# Patient Record
Sex: Female | Born: 1970 | Race: White | Hispanic: No | Smoking: Never smoker
Health system: Southern US, Community
[De-identification: ages and names within clinical notes are randomized; demographics above are authoritative.]

## PROBLEM LIST (undated history)

## (undated) DIAGNOSIS — M545 Low back pain, unspecified: Secondary | ICD-10-CM

## (undated) DIAGNOSIS — K219 Gastro-esophageal reflux disease without esophagitis: Secondary | ICD-10-CM

## (undated) DIAGNOSIS — G629 Polyneuropathy, unspecified: Secondary | ICD-10-CM

## (undated) DIAGNOSIS — N879 Dysplasia of cervix uteri, unspecified: Secondary | ICD-10-CM

## (undated) DIAGNOSIS — K589 Irritable bowel syndrome without diarrhea: Secondary | ICD-10-CM

## (undated) DIAGNOSIS — L719 Rosacea, unspecified: Secondary | ICD-10-CM

## (undated) DIAGNOSIS — G8929 Other chronic pain: Secondary | ICD-10-CM

## (undated) DIAGNOSIS — F32A Depression, unspecified: Secondary | ICD-10-CM

## (undated) DIAGNOSIS — F419 Anxiety disorder, unspecified: Secondary | ICD-10-CM

## (undated) DIAGNOSIS — F329 Major depressive disorder, single episode, unspecified: Secondary | ICD-10-CM

## (undated) HISTORY — DX: Polyneuropathy, unspecified: G62.9

## (undated) HISTORY — DX: Dysplasia of cervix uteri, unspecified: N87.9

## (undated) HISTORY — DX: Anxiety disorder, unspecified: F41.9

## (undated) HISTORY — PX: MICRODISCECTOMY LUMBAR: SUR864

## (undated) HISTORY — DX: Gastro-esophageal reflux disease without esophagitis: K21.9

## (undated) HISTORY — DX: Low back pain, unspecified: G89.29

## (undated) HISTORY — DX: Irritable bowel syndrome, unspecified: K58.9

## (undated) HISTORY — DX: Low back pain: M54.5

## (undated) HISTORY — DX: Low back pain, unspecified: M54.50

## (undated) HISTORY — DX: Major depressive disorder, single episode, unspecified: F32.9

## (undated) HISTORY — DX: Depression, unspecified: F32.A

## (undated) HISTORY — DX: Rosacea, unspecified: L71.9

---

## 2011-08-25 ENCOUNTER — Other Ambulatory Visit: Payer: Self-pay | Admitting: Gastroenterology

## 2011-08-25 DIAGNOSIS — R1011 Right upper quadrant pain: Secondary | ICD-10-CM

## 2011-09-12 ENCOUNTER — Other Ambulatory Visit (HOSPITAL_COMMUNITY): Payer: Self-pay

## 2011-09-20 ENCOUNTER — Other Ambulatory Visit (HOSPITAL_COMMUNITY): Payer: Self-pay

## 2011-09-27 ENCOUNTER — Ambulatory Visit (HOSPITAL_COMMUNITY)
Admission: RE | Admit: 2011-09-27 | Discharge: 2011-09-27 | Disposition: A | Discharge status: Home / Self Care | Payer: BC Managed Care – PPO | Source: Ambulatory Visit | Attending: Gastroenterology | Admitting: Gastroenterology

## 2011-09-27 DIAGNOSIS — R1011 Right upper quadrant pain: Secondary | ICD-10-CM

## 2011-09-27 DIAGNOSIS — K824 Cholesterolosis of gallbladder: Secondary | ICD-10-CM | POA: Insufficient documentation

## 2011-09-27 MED ORDER — SINCALIDE 5 MCG IJ SOLR
INTRAMUSCULAR | Status: AC
  Filled 2011-09-27: qty 5

## 2011-09-27 MED ORDER — SINCALIDE 5 MCG IJ SOLR
0.0200 ug/kg | Freq: Once | INTRAMUSCULAR | Status: AC
  Administered 2011-09-27: 1.26 ug via INTRAVENOUS

## 2011-09-27 MED ORDER — TECHNETIUM TC 99M MEBROFENIN IV KIT
5.0000 | PACK | Freq: Once | INTRAVENOUS | Status: AC | PRN
  Administered 2011-09-27: 5 via INTRAVENOUS

## 2011-10-17 ENCOUNTER — Ambulatory Visit (INDEPENDENT_AMBULATORY_CARE_PROVIDER_SITE_OTHER): Payer: BC Managed Care – PPO | Admitting: General Surgery

## 2011-10-21 ENCOUNTER — Ambulatory Visit (INDEPENDENT_AMBULATORY_CARE_PROVIDER_SITE_OTHER): Payer: BC Managed Care – PPO | Admitting: General Surgery

## 2011-12-01 ENCOUNTER — Encounter (INDEPENDENT_AMBULATORY_CARE_PROVIDER_SITE_OTHER): Payer: Self-pay

## 2013-05-28 IMAGING — US US ABDOMEN COMPLETE
1 series · 14 of 25 positions shown · non-contrast
Comparison: None

CLINICAL DATA: Right upper quadrant pain

ABDOMINAL ULTRASOUND COMPLETE

[Series 1: us abdomen complete · 0.20mm/px · 14 of 77 slices shown]
[im 1/77]
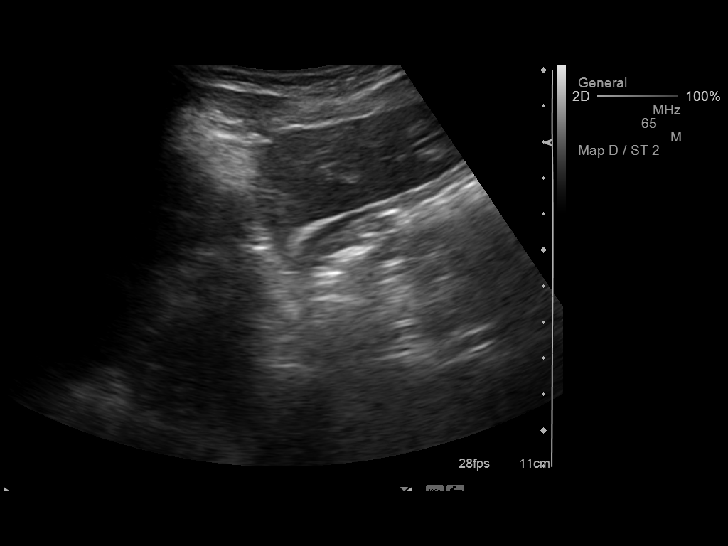
[im 7/77]
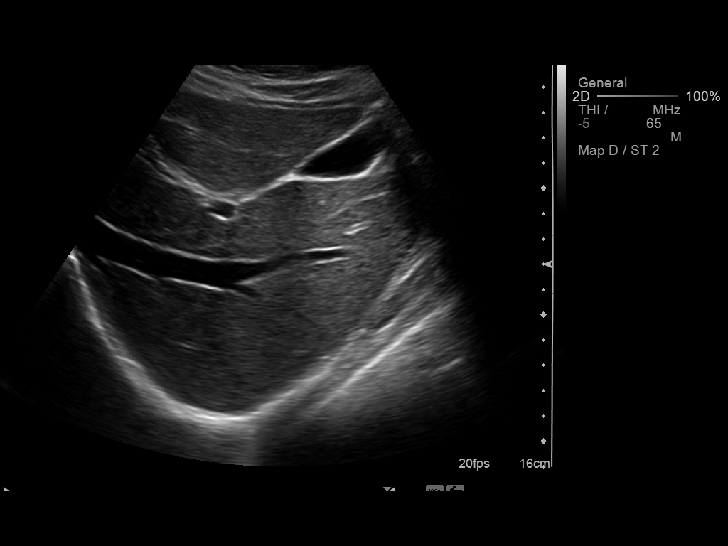
[im 13/77]
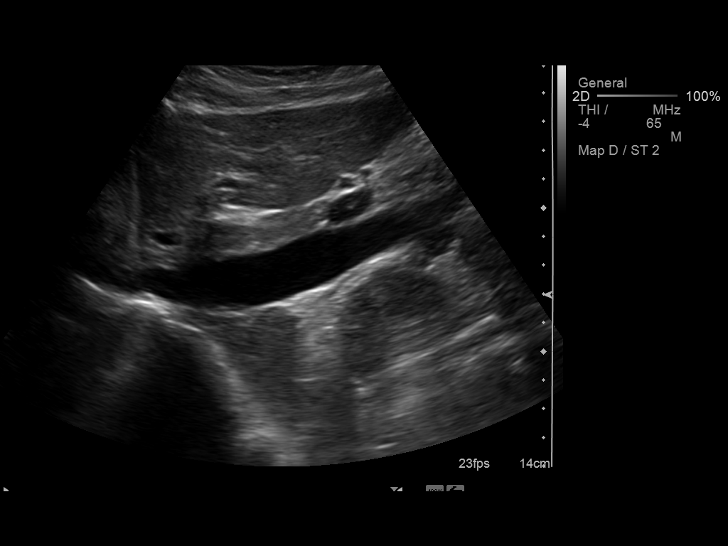
[im 20/77]
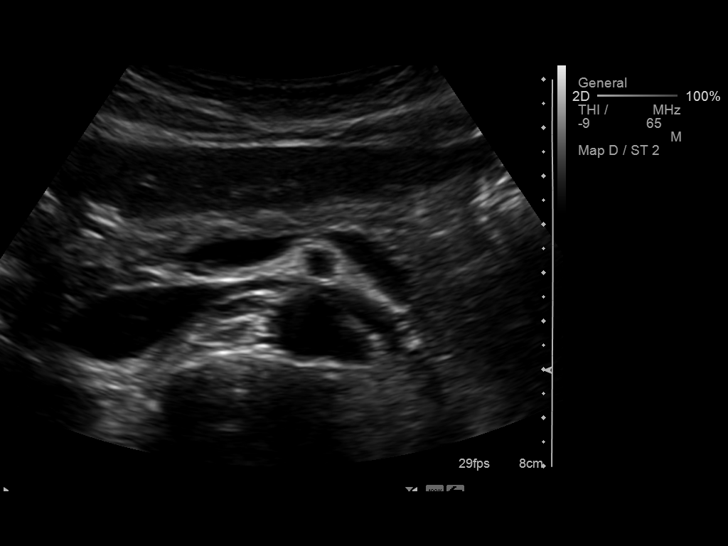
[im 26/77]
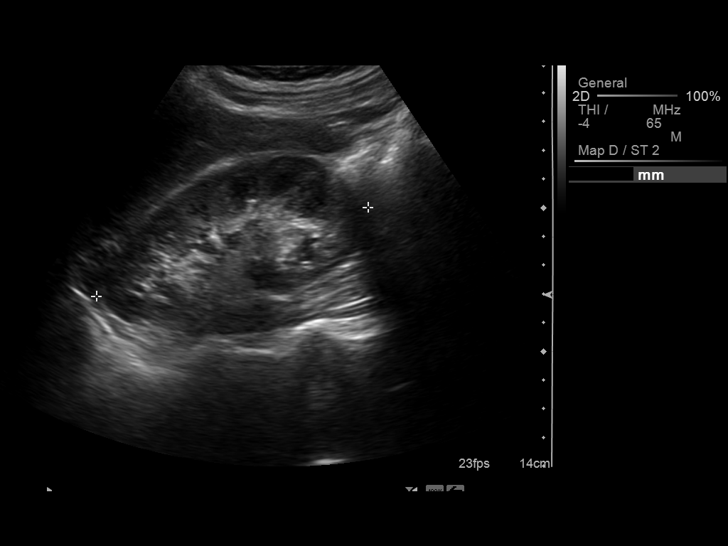
[im 29/77]
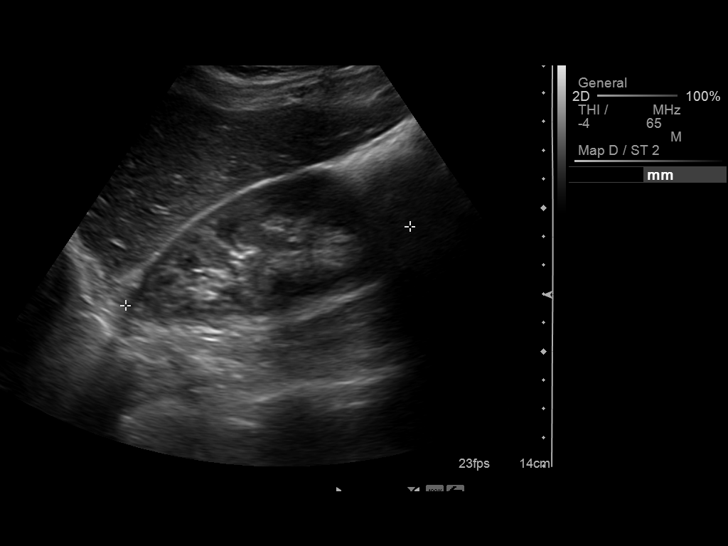
[im 35/77]
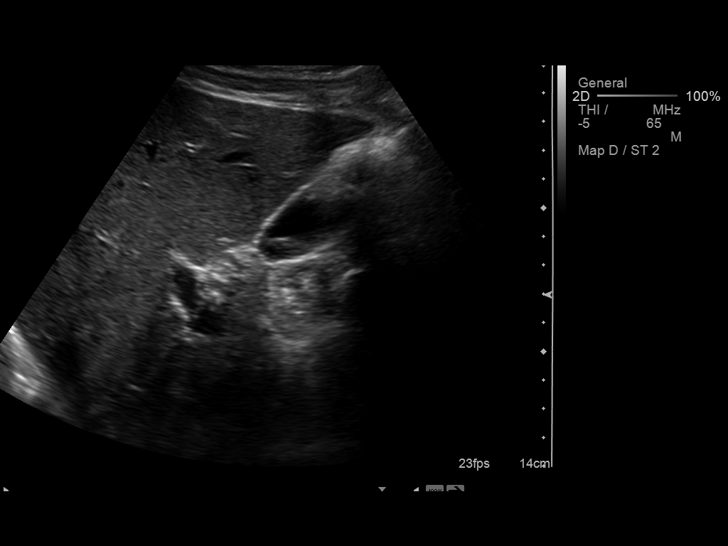
[im 42/77]
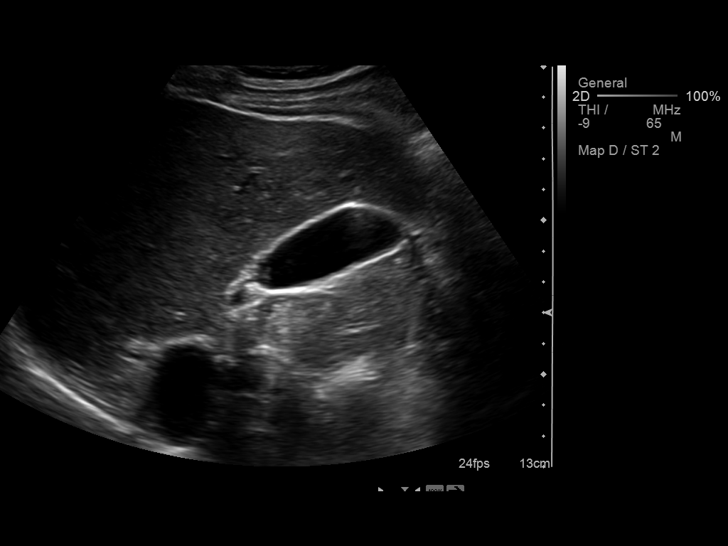
[im 48/77]
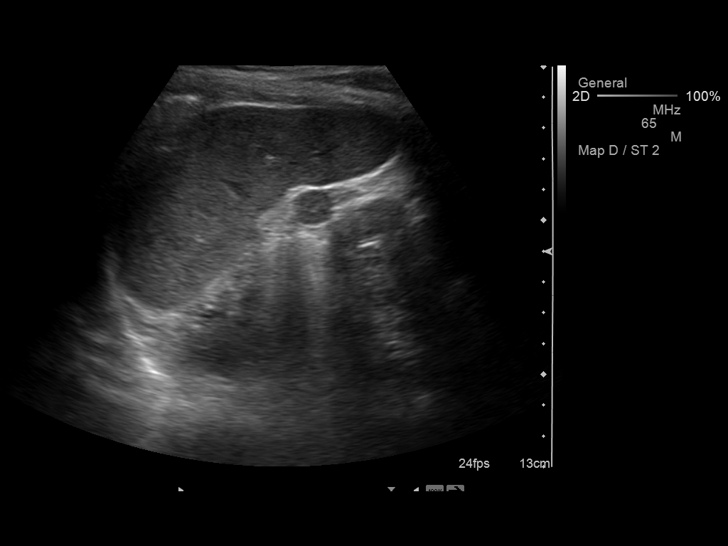
[im 51/77]
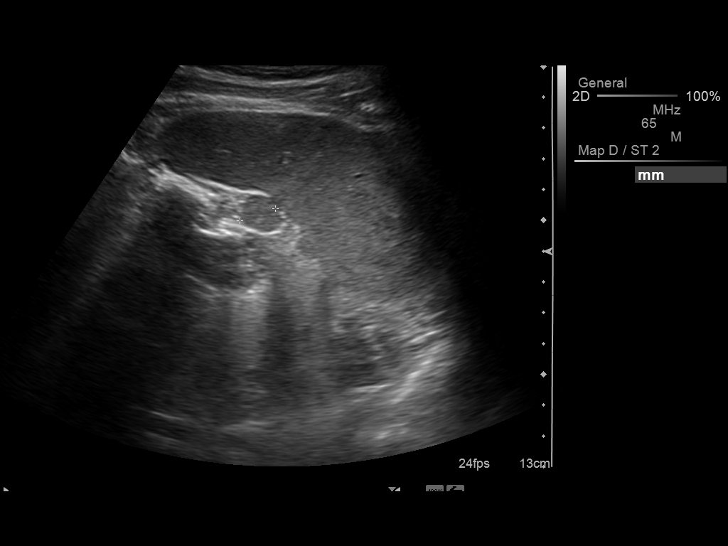
[im 58/77]
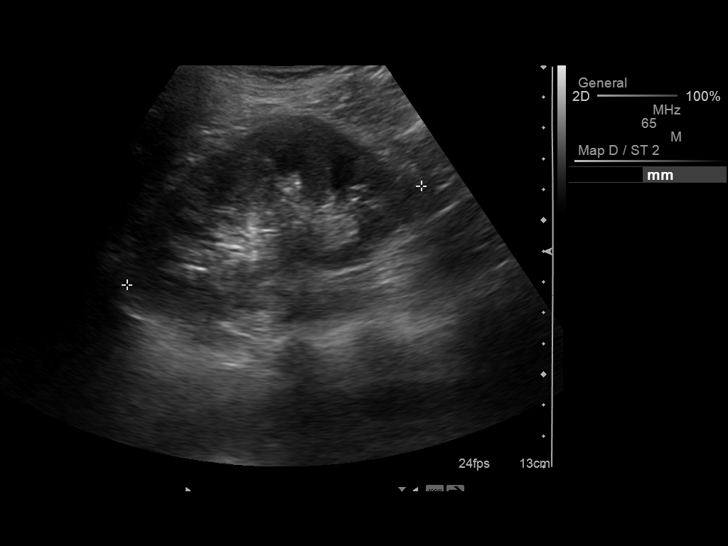
[im 64/77]
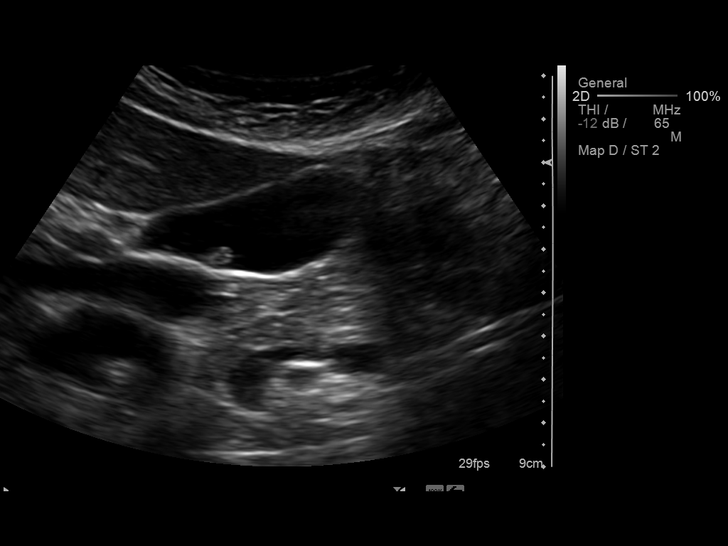
[im 70/77]
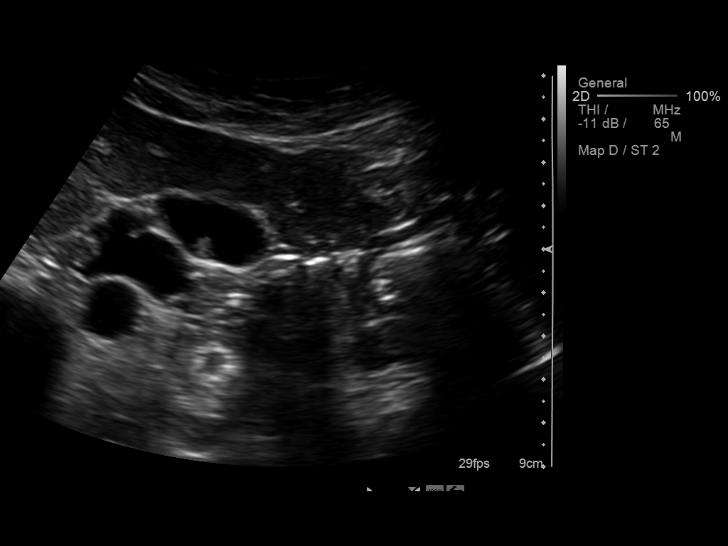
[im 77/77]
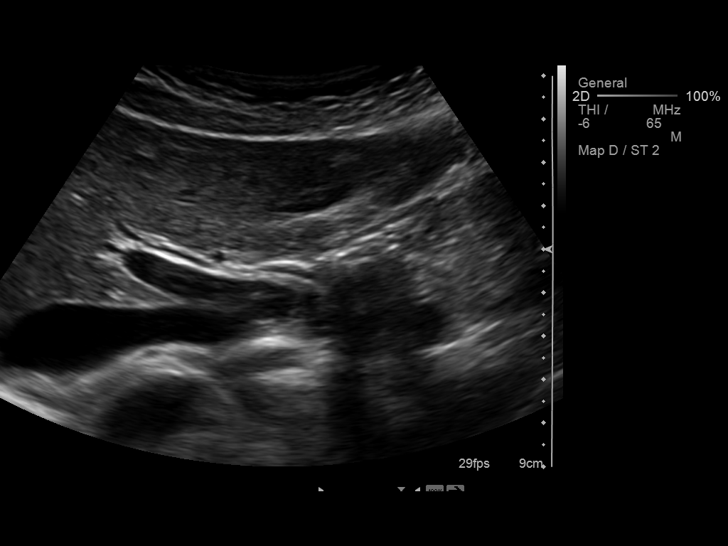

[14 of 25 positions shown; findings below may reference images not displayed]

FINDINGS: Gallbladder:  There are two polyps within the lumen of the
gallbladder which measure approximately 6 mm.  No stones
identified.  There is no gallbladder wall thickening or
pericholecystic fluid.

Common Bile Duct:  Within normal limits in caliber.

Liver: No focal mass lesion identified.  Within normal limits in
parenchymal echogenicity.

IVC:  Appears normal.

Pancreas:  No abnormality identified.

Spleen:  Within normal limits in size and echotexture.

Right kidney:  Normal in size and parenchymal echogenicity.  No
evidence of mass or hydronephrosis.

Left kidney:  Normal in size and parenchymal echogenicity.  No
evidence of mass or hydronephrosis.

Abdominal Aorta:  No aneurysm identified.
IMPRESSION: 1.  No acute findings.
2.  Gallbladder polyps.

## 2013-08-15 ENCOUNTER — Other Ambulatory Visit: Payer: Self-pay | Admitting: Gastroenterology

## 2013-08-15 DIAGNOSIS — K824 Cholesterolosis of gallbladder: Secondary | ICD-10-CM

## 2013-08-21 ENCOUNTER — Ambulatory Visit
Admission: RE | Admit: 2013-08-21 | Discharge: 2013-08-21 | Disposition: A | Discharge status: Home / Self Care | Payer: BC Managed Care – PPO | Source: Ambulatory Visit | Attending: Gastroenterology | Admitting: Gastroenterology

## 2013-08-21 DIAGNOSIS — K824 Cholesterolosis of gallbladder: Secondary | ICD-10-CM

## 2016-03-31 ENCOUNTER — Other Ambulatory Visit: Payer: Self-pay | Admitting: Gastroenterology

## 2016-03-31 DIAGNOSIS — K824 Cholesterolosis of gallbladder: Secondary | ICD-10-CM

## 2016-04-06 ENCOUNTER — Ambulatory Visit
Admission: RE | Admit: 2016-04-06 | Discharge: 2016-04-06 | Disposition: A | Discharge status: Home / Self Care | Payer: 59 | Source: Ambulatory Visit | Attending: Gastroenterology | Admitting: Gastroenterology

## 2016-04-06 DIAGNOSIS — K824 Cholesterolosis of gallbladder: Secondary | ICD-10-CM

## 2017-01-03 ENCOUNTER — Encounter: Payer: Self-pay | Admitting: Allergy

## 2017-01-03 ENCOUNTER — Ambulatory Visit (INDEPENDENT_AMBULATORY_CARE_PROVIDER_SITE_OTHER): Payer: 59 | Admitting: Allergy

## 2017-01-03 VITALS — BP 100/60 | HR 78 | Temp 98.4°F | Resp 14 | Ht 68.0 in | Wt 138.0 lb

## 2017-01-03 DIAGNOSIS — L309 Dermatitis, unspecified: Secondary | ICD-10-CM | POA: Diagnosis not present

## 2017-01-03 MED ORDER — METHYLPREDNISOLONE ACETATE 80 MG/ML IJ SUSP
80.0000 mg | Freq: Once | INTRAMUSCULAR | Status: AC
  Administered 2017-01-03: 80 mg via INTRAMUSCULAR

## 2017-01-03 MED ORDER — CLOBETASOL PROPIONATE 0.05 % EX OINT: 1.0000 "application " | TOPICAL_OINTMENT | Freq: Two times a day (BID) | CUTANEOUS | 0 refills | Status: DC | PRN

## 2017-01-03 NOTE — Patient Instructions (Signed)
Dermatitis   At this time rash of unclear etiology. This does not appear to be urticaria or atopic dermatitis however may be consistent with contact dermatitis.  There is no IgE food allergy component as rash is persistent and is not urticarial in nature.    Depomedrol injection provided in office today.    Let us know if this helps to improve symptoms.  A prescription has been provided for clobetasol 0.05% ointment sparingly to affected areas twice daily as needed.  Continue daily moisturization  May continue current antihistamine regimen with zyrtec, zantac, singulair and atarax at nighttime.    Patch testing discussed for evaluation of possible contact dermatitis.  Would wait at least 2 months after steroids (oral/systemic) to ensure accuracy of patch testing.    Will place dermatology referral for further evaluation and possible biopsy.    Follow-up as needed

## 2017-01-03 NOTE — Progress Notes (Signed)
New Patient Note  RE: Sierra Richardson MRN: [id number] DOB: Jan 09, 1971 Date of Office Visit: 01/03/2017  Referring provider: Olive Bass, MD Primary care provider: Olive Bass, MD  Chief Complaint: rash  History of present illness: Sierra Richardson is a 46 y.o. female presenting today for consultation for dermatitis.   Her current rash has been ongoing for about a year now  She thought at first the rash was realted to hormonal changes however she continued to develop the rash thus she felt it couldn't be related to that.  Rash started initially on her neck and upper back.  She then developed rash on her left breast and was started on mometasone which worked for a while but it stopped working.    She reports the rash kept spreading and is now mostly all over her body.   Rash is very itchy.    Itching is worse when she sweats or is overheated like with warm showers.   Rash has been persistent once it appears.  She denies any fevers, swelling, joint aches/pains with the rash.  No changes in weight.  No pustules, skin blistering/sloughing.  No recent travel prior to onset of rash.  No new medications, foods, stings prior to onset of the rash.  She did start a new shampoo from [employer] around the time of the rash and has continued to use the shampoo occasionally.   No similar rash of household members.    She saw her PCP about a month ago and was prescribed a medrol dose pack x 6 days which helped but didn't clear completely.  She went to [facility] between her PCP visit and today's visit and they gave prescribed nystatin, Bactrim x 7 days and atarax.  She reports the only thing of those prescribed that has been helpful is the atarax which makes her sleepy.    She has been treated for this rash by her PCP with Singulair, Zyrtec twice a day and Zantac twice.  She does not feel that this has helped with the rash.  She states she was on singulair prior to rash starting.  Zyrtec and Zantac she does not feel  has been helpful but is nervous to stop as does not want the rash to worsen as it is intensely pruritic.    She did have baseline labs done showing normal CBC (no diff), CMP and TSH done on 10/13/16.   Review of systems: Review of Systems  Constitutional: Negative for chills, fever, malaise/fatigue and weight loss.  HENT: Negative for congestion, ear discharge, ear pain, nosebleeds, sinus pain, sore throat and tinnitus.   Eyes: Negative for pain, discharge and redness.  Respiratory: Negative for cough, shortness of breath and wheezing.   Cardiovascular: Negative for chest pain.  Gastrointestinal: Negative for abdominal pain, constipation, diarrhea, heartburn, nausea and vomiting.  Musculoskeletal: Negative for joint pain.  Skin: Positive for itching and rash.  Neurological: Negative for headaches.    All other systems negative unless noted above in HPI  Past medical history: Past Medical History:  Diagnosis Date  . Anxiety   . Cervical dysplasia   . Chronic lumbar pain   . Depression   . GERD (gastroesophageal reflux disease)   . IBS (irritable bowel syndrome)   . Neuropathy   . Rosacea     Past surgical history: Past Surgical History:  Procedure Laterality Date  . CESAREAN SECTION     x2  . MICRODISCECTOMY LUMBAR     between L4 and  L5    Family history:  Family History  Problem Relation Age of Onset  . Hypertension Mother   . Hypercholesterolemia Mother   . Diabetes Father   . Hypertension Father   . Heart attack Father   . Sleep apnea Brother     Social history: She lives in a home with carpeting with gas and electric heating and central cooling. There is a dog and cat in the home. There is some mildew around the windows. There is no concern for roaches. She is a Engineer, civil (consulting)nurse with hospice. She has no smoking history.   Medication List: Allergies as of 01/03/2017      Reactions   Gabapentin Rash      Medication List       Accurate as of 01/03/17  5:17 PM.  Always use your most recent med list.          acyclovir 200 MG capsule Commonly known as:  ZOVIRAX   Biotin 1 MG Caps Take by mouth.   cetirizine 10 MG tablet Commonly known as:  ZYRTEC Take 10 mg by mouth 2 (two) times daily as needed for allergies.   cyclobenzaprine 10 MG tablet Commonly known as:  FLEXERIL Take by mouth.   hydrOXYzine 25 MG tablet Commonly known as:  ATARAX/VISTARIL TAKE ONE TABLET BY MOUTH EVERY 6 HOURS AS NEEDED FOR ITCHING   LINZESS 145 MCG Caps capsule Generic drug:  linaclotide   montelukast 10 MG tablet Commonly known as:  SINGULAIR TAKE 1 TABLET ONCE DAILY IN THE EVENING FOR ALLERGY   MULTI-VITAMINS Tabs Take by mouth.   ranitidine 300 MG tablet Commonly known as:  ZANTAC Take 300 mg by mouth.   XULANE 150-35 MCG/24HR transdermal patch Generic drug:  norelgestromin-ethinyl estradiol     Known medication allergies: Allergies  Allergen Reactions  . Gabapentin Rash     Physical examination: Blood pressure 100/60, pulse 78, temperature 98.4 F (36.9 C), temperature source Oral, resp. rate 14, height 5\' 8"  (1.727 m), weight 138 lb (62.6 kg), SpO2 99 %.  General: Alert, interactive, in no acute distress. HEENT: PERRLA, TMs pearly gray, turbinates non-edematous without discharge, post-pharynx non erythematous. Neck: Supple without lymphadenopathy. Lungs: Clear to auscultation without wheezing, rhonchi or rales. {no increased work of breathing. CV: Normal S1, S2 without murmurs. Abdomen: Nondistended, nontender. Skin: There is a fine erythematous papular rash with central excoriation over her chest, back, abdomen, legs bilaterally.  Rash does blanch. Appearance is not consistent with urticarial or eczematous type lesions.. Extremities:  No clubbing, cyanosis or edema. Neuro:   Grossly intact.  Diagnositics/Labs: Labs: Labs will be scanned into the EMR review as above in history of present illness  Assessment and plan:     Dermatitis, Inflammatory   At this time rash of unclear etiology. This does not appear to be urticaria or atopic dermatitis however may be consistent with contact dermatitis.  There is no IgE food allergy component as rash is persistent and is not urticarial in nature.    Depomedrol injection provided in office today.    Let us know if this helps to improve symptoms.  A prescription has been provided for clobetasol 0.05% ointment sparingly to affected areas twice daily as needed.  Continue daily moisturization  May continue current antihistamine regimen with zyrtec, zantac, singulair and atarax at nighttime.    Patch testing discussed for evaluation of possible contact dermatitis.  Would wait at least 2 months after steroids (oral/systemic) to ensure accuracy of patch testing.  Will place dermatology referral for further evaluation and possible biopsy.    Follow-up as needed   I appreciate the opportunity to take part in Shadai's care. Please do not hesitate to contact me with questions.  Sincerely,   Margo Aye, MD Allergy/Immunology [facility]

## 2017-01-05 LAB — C-REACTIVE PROTEIN: CRP: 1.1 mg/L (ref 0.0–4.9)

## 2017-01-05 LAB — CBC WITH DIFFERENTIAL/PLATELET
BASOS: 0 %
Basophils Absolute: 0 10*3/uL (ref 0.0–0.2)
EOS (ABSOLUTE): 0.5 10*3/uL — ABNORMAL HIGH (ref 0.0–0.4)
EOS: 6 %
HEMOGLOBIN: 13.1 g/dL (ref 11.1–15.9)
Hematocrit: 39.8 % (ref 34.0–46.6)
IMMATURE GRANULOCYTES: 0 %
Immature Grans (Abs): 0 10*3/uL (ref 0.0–0.1)
LYMPHS: 37 %
Lymphocytes Absolute: 3.2 10*3/uL — ABNORMAL HIGH (ref 0.7–3.1)
MCH: 30.5 pg (ref 26.6–33.0)
MCHC: 32.9 g/dL (ref 31.5–35.7)
MCV: 93 fL (ref 79–97)
Monocytes Absolute: 0.6 10*3/uL (ref 0.1–0.9)
Monocytes: 6 %
NEUTROS ABS: 4.4 10*3/uL (ref 1.4–7.0)
Neutrophils: 51 %
Platelets: 318 10*3/uL (ref 150–379)
RBC: 4.3 x10E6/uL (ref 3.77–5.28)
RDW: 13.7 % (ref 12.3–15.4)
WBC: 8.7 10*3/uL (ref 3.4–10.8)

## 2017-01-05 LAB — SEDIMENTATION RATE: Sed Rate: 2 mm/hr (ref 0–32)

## 2017-01-05 LAB — ANA W/REFLEX IF POSITIVE: Anti Nuclear Antibody(ANA): NEGATIVE

## 2017-03-28 ENCOUNTER — Ambulatory Visit: Payer: Managed Care, Other (non HMO) | Admitting: Allergy

## 2017-03-28 ENCOUNTER — Encounter: Payer: Self-pay | Admitting: Allergy

## 2017-03-28 VITALS — BP 104/70 | HR 60 | Resp 16

## 2017-03-28 DIAGNOSIS — L300 Nummular dermatitis: Secondary | ICD-10-CM

## 2017-03-28 NOTE — Progress Notes (Signed)
Follow-up Note  RE: Sierra Richardson MRN: [id number] DOB: 01/14/1971 Date of Office Visit: 03/28/2017   History of present illness: Sierra Richardson is a 46 y.o. female presenting today for follow-up of dermatitis.  She was initially seen in the office on 01/03/17 by myself for rash.  At this visit her rash did not resemble that urticaria or eczema.  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  She did have a biopsy that showed "perivascular dermatitis with eosinophils with focal spongiosis, excoriated".  Microscopic description of biopsy reports "perivascular superficial and mild dermal inflammatory infiltrate of lymphocytes, histiocytes and scattered eosinophils in dermis.  The epidermis shows foci of spongiosis." Per biopsy report early eczematous dermatitis in differential as well as hypersensitivity reaction.   She was changed to clobetasol cream by dermatology as well as advised to take Allegra 3 pills a day and aggressive moisturization.  She has been given oral and IM steroid injections as well with kenalog given on 03/06/17.  She is currently using cetaphil for moisturizing.  She had been using coconut oil that she is unsure if it was worsening rash or not.  The rash remains itchy and she continues to get outbreaks of new rash and has residual excoriated areas from scratching.  She is unsure as to what is triggering the rash.  She did hold her allegra for testing today and does not feel any itchier than usual.   After testing she does state her significant other does smoke outside the home but does not take off clothes he has smoked in once he comes back in.    Review of systems: Review of Systems  Constitutional: Negative for chills, fever and malaise/fatigue.  HENT: Negative for congestion, ear discharge, ear pain, nosebleeds, sinus pain, sore throat and tinnitus.   Eyes: Negative for pain, discharge and redness.  Respiratory: Negative for cough,  shortness of breath and wheezing.   Cardiovascular: Negative for chest pain.  Gastrointestinal: Negative for abdominal pain, constipation, diarrhea, heartburn, nausea and vomiting.  Musculoskeletal: Negative for joint pain.  Skin: Positive for itching and rash.  Neurological: Negative for headaches.    All other systems negative unless noted above in HPI  Past medical/social/surgical/family history have been reviewed and are unchanged unless specifically indicated below.  No changes  Medication List: Allergies as of 03/28/2017      Reactions   Gabapentin Rash      Medication List        Accurate as of 03/28/17  4:35 PM. Always use your most recent med list.          acyclovir 200 MG capsule Commonly known as:  ZOVIRAX   Biotin 1 MG Caps Take by mouth.   clobetasol cream 0.05 % Commonly known as:  TEMOVATE APPLY TO itchy AREA THREE TIMES DAILY FOR 7 DAYS, THEN TWICE DAILY FOR 7 DAYS, EVERY DAY FOR 7 DAYS, THEN AS NEEDED   cyclobenzaprine 10 MG tablet Commonly known as:  FLEXERIL Take by mouth.   fexofenadine 180 MG tablet Commonly known as:  ALLEGRA Take 360 mg by mouth daily.   LINZESS 145 MCG Caps capsule Generic drug:  linaclotide   montelukast 10 MG tablet Commonly known as:  SINGULAIR TAKE 1 TABLET ONCE DAILY IN THE EVENING FOR ALLERGY   MULTI-VITAMINS Tabs Take by mouth.   ranitidine 300 MG tablet Commonly known as:  ZANTAC Take 300 mg by mouth.   TURMERIC PO Take by mouth.  XULANE 150-35 MCG/24HR transdermal patch Generic drug:  norelgestromin-ethinyl estradiol       Known medication allergies: Allergies  Allergen Reactions  . Gabapentin Rash     Physical examination: Blood pressure 104/70, pulse 60, resp. rate 16.  General: Alert, interactive, in no acute distress. HEENT: PERRLA, TMs pearly gray, turbinates non-edematous without discharge, post-pharynx non erythematous. Neck: Supple without lymphadenopathy. Lungs: Clear to  auscultation without wheezing, rhonchi or rales. {no increased work of breathing. CV: Normal S1, S2 without murmurs. Abdomen: Nondistended, nontender. Skin: several diffuse erythematous excoriated papules.  Several areas of erythematous patches on left flank and right post arm. Extremities:  No clubbing, cyanosis or edema. Neuro:   Grossly intact.  Diagnositics/Labs:  Allergy testing: environmental skin prick testing is positive to ragweed(giant), aspergillus, cockroach, tobacco.  Intradermal testing was negative to dog and dust mites Select food allergy skin prick testing is negative to peanut, wheat, milk, egg, fish mix, tomato, Mal[place]urkey, banana, strawberry, chocolate, coconut, black pepper Allergy testing results were read and interpreted by provider, documented by clinical staff.   Assessment and plan:   Dermatitis   At this time etiology of rash still remains unclear.  Biopsy results appear consistent with atopic trigger with eczematous vs hypersensitivity reaction.  Environmental and food allergy testing today is positive to ragweed, mold, cockroach and tobacco.  I do not feel that these sensitivities are major trigger of her symptoms however I have counseled on avoidance measures.    There however may be a component of contact dermatitis which has not been formally ruled out yet.  Will plan for patch testing early next year and have advised will need to be steroid free for about a month for testing to be valid.     Continue current recommendation from dermatology with clobetasol cream. Continue daily moisturization.  Continue Allegra use.     I have provided with non-steroidal topical agent, Eucrisa, to see if this provided any additional relief especially with pruritus.  She can use this alone on in conjunction with topical steroid.   We also discussed other non-steroidal options, Elidel and Protopic.  Itch is a big component and also discussed other alternative agents for itch control  like SSRI.  She states dermatology has discussed MTX as potential option which she would not like to do given immunosuppresive agent.   I did discuss option of Dupixent as potential treatment option however would like to try non-steroidal topical agents first.      Follow-up for patch test placement  I appreciate the opportunity to take part in Nary's care. Please do not hesitate to contact me with questions.  Sincerely,   Margo AyeShaylar Padgett, MD Allergy/Immunology [facility]

## 2017-03-28 NOTE — Patient Instructions (Addendum)
Dermatitis   At this time etiology of rash still remains unclear.  Biopsy results appear consistent with atopic trigger.  Environmental and food allergy testing today is positive to ragweed, mold, cockroach and tobacco.  I do not feel that these sensitivities are major trigger of her symptoms however I have counseled on avoidance measures.    There however may be a component of contact dermatitis which has not been formally ruled out yet.  Will plan for patch testing early next year and have advised will need to be steroid free for about a month for testing to be valid.     Continue current recommendation from dermatology with clobetasol cream. Continue daily moisturization.  Continue Allegra use.     I have provided with non-steroidal topical agent, Eucrisa, to see if this provided any additional relief especially with pruritus.  She can use this alone on in conjunction with topical steroid.   We also discussed other non-steroidal options, Elidel and Protopic.  Itch is a big component and also discussed other alternative agents for itch control like SSRI.  She states dermatology has discussed MTX as potential option which she would not like to do given immunosuppresive agent.   I did discuss option of Dupixent as potential treatment option however would like to try non-steroidal topical agents first.      Follow-up for patch test placement

## 2017-04-11 ENCOUNTER — Other Ambulatory Visit: Payer: Self-pay | Admitting: Gastroenterology

## 2017-04-11 DIAGNOSIS — K824 Cholesterolosis of gallbladder: Secondary | ICD-10-CM

## 2017-05-02 ENCOUNTER — Encounter: Payer: Self-pay | Admitting: Allergy

## 2017-05-02 ENCOUNTER — Ambulatory Visit (INDEPENDENT_AMBULATORY_CARE_PROVIDER_SITE_OTHER): Payer: BLUE CROSS/BLUE SHIELD | Admitting: Allergy

## 2017-05-02 DIAGNOSIS — L300 Nummular dermatitis: Secondary | ICD-10-CM | POA: Diagnosis not present

## 2017-05-02 NOTE — Progress Notes (Signed)
    Follow-up Note  RE: Sierra Richardson MRN: [id number] DOB: 06/15/1970 Date of Office Visit: 05/02/2017  Primary care provider: Olive Bassough, Robert L, MD Referring provider: Olive Bassough, Robert L, MD   Sierra Richardson returns to the office today for the patch placement today, given suspected history of possible contact dermatitis.  She was last seen in the office on 03/28/17 for follow-up and was decided to proceed with patch testing as testing for environmental allergens showed sensitivity to ragweed, mold, cockroach and tobacco.  Sierra Richardson did not believe these exposures were causing her dermatitis.  Food allergy testing was negative.   She has been off of systemic steroids since November.     Diagnostics:  TRUE test patches applied to back and taped in place.    Plan:  Sierra Richardson will return in 2 days for patch removal and reading.     Sierra AyeShaylar Johathon Overturf, MD [facility]

## 2017-05-04 ENCOUNTER — Ambulatory Visit (INDEPENDENT_AMBULATORY_CARE_PROVIDER_SITE_OTHER): Payer: BLUE CROSS/BLUE SHIELD | Admitting: Allergy

## 2017-05-04 DIAGNOSIS — L300 Nummular dermatitis: Secondary | ICD-10-CM

## 2017-05-04 DIAGNOSIS — L253 Unspecified contact dermatitis due to other chemical products: Secondary | ICD-10-CM

## 2017-05-04 MED ORDER — FLUOCINOLONE ACETONIDE SCALP 0.01 % EX OIL: TOPICAL_OIL | CUTANEOUS | 5 refills | Status: AC

## 2017-05-04 NOTE — Progress Notes (Signed)
    Follow-up Note  RE: Sierra AlkenLisa Richardson MRN: [id number] DOB: 10/07/1970 Date of Office Visit: 05/04/2017  Primary care provider: Olive Bassough, Robert L, MD Referring provider: Olive Bassough, Robert L, MD   [name] returns to the office today for the initial patch test interpretation, given suspected history of contact dermatitis.    Diagnostics:  TRUE TEST 48 hour reading: erythematous papules at #8 paraben mix, #14 epoxy resin and erythematous square at #28 gold  Plan:  Allergic contact dermatitis  The patient has been provided detailed information regarding the substances she is sensitive to, as well as products containing the substances.  Meticulous avoidance of these substances is recommended. If avoidance is not possible, the use of barrier creams or lotions is recommended. If symptoms persist or progress despite meticulous avoidance of above substances [facility] for second opinion of dermatitis as not significantly improved with topical steroids.   Will also have her try dermasmoothe oil for scalp treatment as this is a big problem for her.    Margo AyeShaylar Padgett, MD [facility]

## 2017-05-10 ENCOUNTER — Telehealth: Payer: Self-pay

## 2017-05-10 NOTE — Telephone Encounter (Signed)
Thanks

## 2017-05-10 NOTE — Telephone Encounter (Signed)
Patient is scheduled to [facility] on 07/14/17. Patient informed

## 2017-05-24 ENCOUNTER — Ambulatory Visit: Payer: Managed Care, Other (non HMO) | Admitting: Allergy and Immunology

## 2017-06-02 ENCOUNTER — Ambulatory Visit (INDEPENDENT_AMBULATORY_CARE_PROVIDER_SITE_OTHER): Payer: BLUE CROSS/BLUE SHIELD | Admitting: Family Medicine

## 2017-06-02 ENCOUNTER — Encounter: Payer: Self-pay | Admitting: Family Medicine

## 2017-06-02 ENCOUNTER — Other Ambulatory Visit: Payer: 59

## 2017-06-02 VITALS — BP 112/70 | HR 80 | Resp 16

## 2017-06-02 DIAGNOSIS — L300 Nummular dermatitis: Secondary | ICD-10-CM | POA: Diagnosis not present

## 2017-06-02 MED ORDER — HYDROXYZINE HCL 10 MG PO TABS: 10.0000 mg | ORAL_TABLET | Freq: Every evening | ORAL | 0 refills | Status: AC | PRN

## 2017-06-02 MED ORDER — CLOBETASOL PROPIONATE 0.05 % EX CREA: TOPICAL_CREAM | CUTANEOUS | 0 refills | Status: DC

## 2017-06-02 NOTE — Patient Instructions (Addendum)
Dermatitis   At this time etiology of rash still remains unclear.  Biopsy results appear consistent with atopic trigger.  Environmental and food allergy testing previously was positive to ragweed, mold, cockroach and tobacco.  These sensitivities are not likely a major trigger of her symptoms. Avoidance measures have been provided   There however may be a component of contact dermatitis which patch testing indicated 3 likely suspects    Continue current recommendation from dermatology with clobetasol cream. Continue daily moisturization.  Continue Allegra or another antihistamine.     She states dermatology has discussed MTX as potential option which she would not like to do given immunosuppresive agent.   I did discuss option of Dupixent as potential treatment option however would like to try non-steroidal topical agents first.    Prednisone 10 mg tablets. Take 2 tablets for 4 days, then take 1 tablet on the 5th day  Information on elimination diets provided  Alpha gal panel  Follow up with other food testing     Follow-up for food skin testing

## 2017-06-02 NOTE — Progress Notes (Signed)
[address] [place] [address] Dept: [phone]  FOLLOW UP NOTE  Patient ID: Sierra Richardson, female    DOB: 11-Apr-1971  Age: 47 y.o. MRN: [id number] Date of Office Visit: 06/02/2017  Assessment  Chief Complaint: Rash and Urticaria  HPI Sierra Richardson is a 47 year old female who presents to the clinic today for a sick visit.  She was last seen in this clinic on 05/04/2017 by Dr. Delorse Lek for evaluation of patch testing that was placed for an identified source of dermatitis.  At that visit, true test patch testing was positive for #8 paraben mix, #14 epoxy resin, and never 28 gold.  She was provided with information regarding products containing these substances and avoidance measures were recommended.  At a previous visit, on skin testing she was was positive for giant ragweed, Aspergillus mix, cockroach, and tobacco leaf.  Selected foods were noted to be negative for peanut, wheat, milk, egg, fish mix, coconut, [place] meat, tomato, banana, strawberry, chocolate, and black pepper.  At today's visit, Sierra Richardson reports a rash that began on her abdomen this morning.  The rash is reported as extremely itchy, raised, red, and with a slight burn.  She reports another rash that covers her body that began spreading in July 2018.  She has visited [facility] dermatology who recommended clobetasol cream.  She is currently taking montelukast 10 mg Allegra 180 mg 2-3 tablets a day, Zantac 300 mg twice a day, NyQuil, and a daily vitamin.  She denies any new soaps, perfumes, dyes, clothing, medications, and foods.  Additionally she denies any unintentional weight loss, fevers, night sweats, joint pain, or muscle pain.   Drug Allergies:  Allergies  Allergen Reactions  . Gabapentin Rash    Physical Exam: BP 112/70   Pulse 80   Resp 16    Physical Exam  Constitutional: She is oriented to person, place, and time. She appears well-developed and well-nourished.  HENT:  Right Ear: External ear normal.    Left Ear: External ear normal.  Nose: Nose normal.  Mouth/Throat: Oropharynx is clear and moist.  Eyes: Conjunctivae are normal.  Neck: Normal range of motion. Neck supple.  Cardiovascular: Normal rate, regular rhythm and normal heart sounds.  S1-S2 normal.  Regular heart rate and rhythm.  No murmur noted.  Pulmonary/Chest: Effort normal and breath sounds normal.  Lungs clear to auscultation.  Musculoskeletal: Normal range of motion.  Neurological: She is alert and oriented to person, place, and time.  Skin: Skin is warm and dry. Rash noted.  Erythematous raised confluent rash scattered over lower abdomen.  No open areas or drainage noted.      Assessment and Plan: 1. Nummular eczematous dermatitis     Meds ordered this encounter  Medications  . clobetasol cream (TEMOVATE) 0.05 %    Sig: APPLY TO ITCHY AREA AS NEEDED    Dispense:  30 g    Refill:  0  . hydrOXYzine (ATARAX/VISTARIL) 10 MG tablet    Sig: Take 1 tablet (10 mg total) by mouth at bedtime as needed for up to 30 doses. Take 1 tablet at bedtime to help with nighttime itching.    Dispense:  30 tablet    Refill:  0    Patient Instructions  Dermatitis   At this time etiology of rash still remains unclear.  Biopsy results appear consistent with atopic trigger.  Environmental and food allergy testing previously was positive to ragweed, mold, cockroach and tobacco.  These sensitivities are not likely a major trigger  of her symptoms. Avoidance measures have been provided   There however may be a component of contact dermatitis which patch testing indicated 3 likely suspects    Continue current recommendation from dermatology with clobetasol cream. Continue daily moisturization.  Continue Allegra or another antihistamine.     She states dermatology has discussed MTX as potential option which she would not like to do given immunosuppresive agent.   I did discuss option of Dupixent as potential treatment option however would  like to try non-steroidal topical agents first.    Prednisone 10 mg tablets. Take 2 tablets for 4 days, then take 1 tablet on the 5th day  Information on elimination diets provided  Alpha gal panel  Follow up with other food testing     Follow-up for food skin testing    Return in about 4 weeks (around 06/30/2017).   Sierra StanleyLisa is currently experiencing a flare of a rash that has been occurring since July 2018.  She is currently using the recommended therapy for urticaria as well as atopic dermatitis.  I have prescribed medication that may help her sleep at night without scratching.  I will proceed with an alpha gal level today.  We have talked about biologic therapy at today's visit, however, Sierra StanleyLisa is very hesitant to move down that route.  I have provided written information that she can consider at home. She does have [facility] who may be able to assist in this matter.  I will follow up with her as her lab test results returns.  Thank you for the opportunity to care for this patient.  Please do not hesitate to contact me with questions.  Thermon LeylandAnne Kashtyn Jankowski, FNP [facility]  I have provided oversight concerning Thermon Leylandnne Teyonna Plaisted' evaluation and treatment of this patient's health issues addressed during today's encounter. I agree with the assessment and therapeutic plan as outlined in the note.   Signed,   Jessica PriestEric J. Kozlow, MD,  Allergy and Immunology,  [facility].

## 2017-06-05 ENCOUNTER — Other Ambulatory Visit: Payer: Self-pay | Admitting: *Deleted

## 2017-06-05 MED ORDER — CLOBETASOL PROP EMOLLIENT BASE 0.05 % EX CREA: TOPICAL_CREAM | CUTANEOUS | 0 refills | Status: AC

## 2017-06-08 LAB — ALPHA-GAL PANEL
Alpha Gal IgE*: <0.1 kU/L (ref <0.10)
BEEF CLASS INTERPRETATION: 0
Class Interpretation: 0
Class Interpretation: 0
Lamb/Mutton (Ovis spp) IgE: <0.1 kU/L (ref <0.35)

## 2017-06-09 ENCOUNTER — Other Ambulatory Visit: Payer: 59

## 2017-06-22 ENCOUNTER — Other Ambulatory Visit: Payer: Self-pay | Admitting: Gastroenterology

## 2017-06-22 DIAGNOSIS — R1013 Epigastric pain: Secondary | ICD-10-CM

## 2017-06-23 ENCOUNTER — Ambulatory Visit: Payer: BLUE CROSS/BLUE SHIELD | Admitting: Family Medicine

## 2017-06-23 ENCOUNTER — Encounter: Payer: Self-pay | Admitting: Family Medicine

## 2017-06-23 VITALS — BP 112/74 | HR 72 | Resp 14

## 2017-06-23 DIAGNOSIS — L300 Nummular dermatitis: Secondary | ICD-10-CM | POA: Diagnosis not present

## 2017-06-23 DIAGNOSIS — L298 Other pruritus: Secondary | ICD-10-CM | POA: Diagnosis not present

## 2017-06-23 NOTE — Patient Instructions (Addendum)
1. Nummular eczematous dermatitis - Your skin testing today was negative to pecan, walnut, almond, hazlenut, pistachio, tuna, salmon, flounder, codfish, crab, oat, saccharomyces cerevisiae, avacado, cabbage, cucumber, and grape.  - I will place a lab order for IgE Kale, total IgE, thyroid peroxidase antibodies, antithyroglobulin antibody - Continue Zyzal twice a day - Continue Zantac 300 mg twice a day - Continue montelukast 10 mg once a day - Continue daily moisturizing routine - Keep your appointment with Dermatology on 07/14/2017 I will call you with lab results as they become available, usually 1-2 weeks  Follow up in 3 months or sooner as needed

## 2017-06-23 NOTE — Progress Notes (Addendum)
[address] [place] [address] Dept: [phone]  FOLLOW UP NOTE  Patient ID: Sierra Richardson, female    DOB: 07/22/1970  Age: 47 y.o. MRN: [id number] Date of Office Visit: 06/23/2017  Assessment  Chief Complaint: Rash and Allergy Testing  HPI Sierra Richardson is a 47 year old female who presents to the clinic for follow up skin testing for selected foods. She was last in this clinic on 06/02/2017 by Mort Smelser, NP for evaluation of an eczematous rash. At that visit, she reported she had a new outbreak of the rash that occurred on her abdomen that same morning. She was continued on clobetasol cream, fexofenadine, and ranitidine. A serum aplha gal was collected and determined to be negative.   At today's visit, she reports that she has stopped eating kale and most other green vegetables over the last 3 weeks. She notes that there has been some improvement in her breakthrough red and itchy areas, however, the older red bumpy areas on her legs, arms, and trunk remains unchanged in appearance and continues to itch. There have been no new episodes of hives since her last visit. She reports she has been off all antihistamines for the last 3 days with just a small amount of itching. She continues to take Xyzal, hydroxyzine, ranitidine, and montelukast daily. She continues to employ a daily moisturizing routine and apply clobetasol sparingly to red itchy areas. Sierra Richardson has an appointment with her dermatologist on July 14, 2017.    Drug Allergies:  Allergies  Allergen Reactions  . Gabapentin Rash    Physical Exam: BP 112/74   Pulse 72   Resp 14    Physical Exam  Constitutional: She is oriented to person, place, and time. She appears well-developed and well-nourished.  HENT:  Right Ear: External ear normal.  Left Ear: External ear normal.  Nose: Nose normal.  Mouth/Throat: Oropharynx is clear and moist.  Eyes: Conjunctivae are normal.  Neck: Normal range of motion. Neck supple.  Cardiovascular:  Normal rate, regular rhythm and normal heart sounds.  S1S2 normal. Regular rate and rhythm. No murmur noted.   Pulmonary/Chest: Effort normal and breath sounds normal.  Lungs clear to auscultation  Musculoskeletal: Normal range of motion.  Neurological: She is alert and oriented to person, place, and time.  Skin: Skin is warm and dry.  Psychiatric: She has a normal mood and affect. Her behavior is normal.    Diagnostics: Percutaneous skin testing was negative to pecan, walnut, almond, hazelnut, pistachio, tuna, salmon, flounder, codfish, crab, oat, saccharomyces cerevisiae, avocado, cabbage, cucumber, and grape.   Assessment and Plan: 1. Nummular eczematous dermatitis   2. Chronic pruritic rash in adult     1. Nummular eczematous dermatitis - Your skin testing today was negative to pecan, walnut, almond, hazlenut, pistachio, tuna, salmon, flounder, codfish, crab, oat, saccharomyces cerevisiae, avacado, cabbage, cucumber, and grape.  - I will place a lab order for IgE Kale, total IgE, thyroid peroxidase antibodies, antithyroglobulin antibody - Continue Zyzal twice a day - Continue Zantac 300 mg twice a day - Continue montelukast 10 mg once a day - Continue daily moisturizing routine - Keep your appointment with Dermatology on 07/14/2017  I will call you with lab results as they become available, usually 1-2 weeks  Follow up in 3 months or sooner as needed   Return in about 3 months (around 09/23/2017), or if symptoms worsen or fail to improve.   Sierra Richardson has isolated several foods that she believes may be contributing to  her pruritic rash that began in July 2018. She has eliminated Candise Che and has noticed a slight improvement in her skin and decrease in itch. Skin testing to those foods was negative today. She will continue with her current medication regimen. I will check a total IgE and thyroid antibodies today. She will see [facility] on 07/14/2017.   Thank you for the  opportunity to care for this patient.  Please do not hesitate to contact me with questions.  Thermon Leyland, FNP [facility]  I have provided oversight concerning Thermon Leyland' evaluation and treatment of this patient's health issues addressed during today's encounter. I agree with the assessment and therapeutic plan as outlined in the note.   Signed,   Jessica Priest, MD,  Allergy and Immunology,  [facility] [place].

## 2017-06-27 ENCOUNTER — Ambulatory Visit: Payer: BLUE CROSS/BLUE SHIELD | Admitting: Allergy

## 2017-07-01 LAB — IGE: IgE (Immunoglobulin E), Serum: 8 IU/mL (ref 0–100)

## 2017-07-01 LAB — THYROGLOBULIN LEVEL: Thyroglobulin (TG-RIA): 7.5 ng/mL

## 2017-07-01 LAB — THYROID PEROXIDASE ANTIBODY: Thyroperoxidase Ab SerPl-aCnc: 8 IU/mL (ref 0–34)

## 2017-07-06 ENCOUNTER — Ambulatory Visit
Admission: RE | Admit: 2017-07-06 | Discharge: 2017-07-06 | Disposition: A | Discharge status: Home / Self Care | Payer: BLUE CROSS/BLUE SHIELD | Source: Ambulatory Visit | Attending: Gastroenterology | Admitting: Gastroenterology

## 2017-07-06 DIAGNOSIS — R1013 Epigastric pain: Secondary | ICD-10-CM

## 2017-12-06 IMAGING — US US ABDOMEN LIMITED
1 series · 14 of 25 positions shown · non-contrast
Comparison: Abdominal ultrasound dated August 21, 2013

CLINICAL DATA: Follow-up of a known gallbladder polyps

EXAM:
US ABDOMEN LIMITED - RIGHT UPPER QUADRANT

[Series 1: us abdomen limited · 0.20mm/px · 14 of 50 slices shown]
[im 1/50]
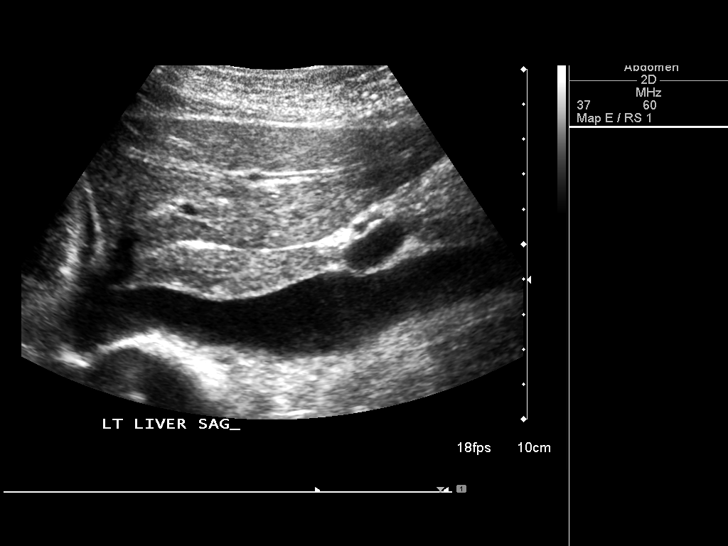
[im 5/50]
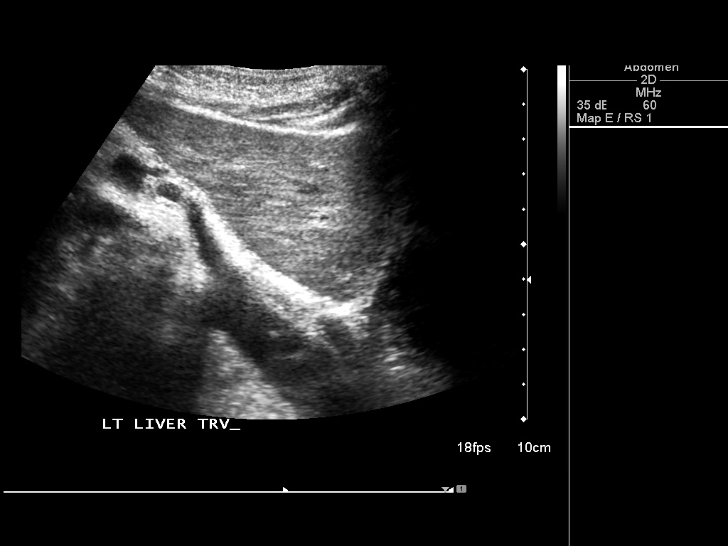
[im 9/50]
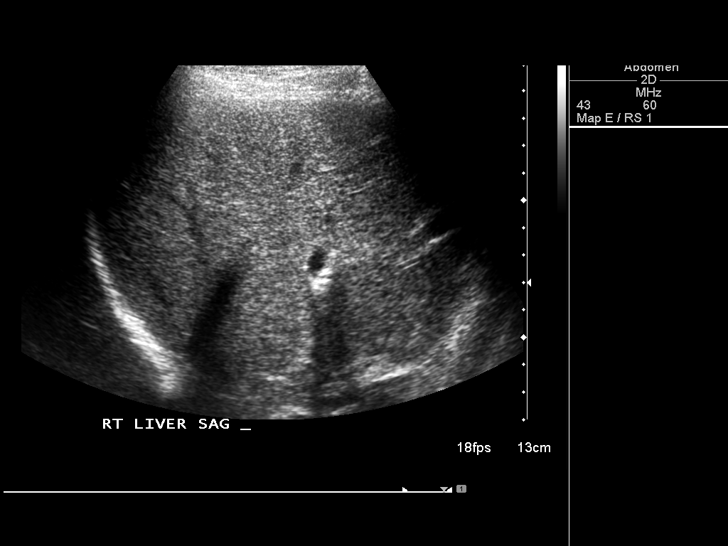
[im 13/50]
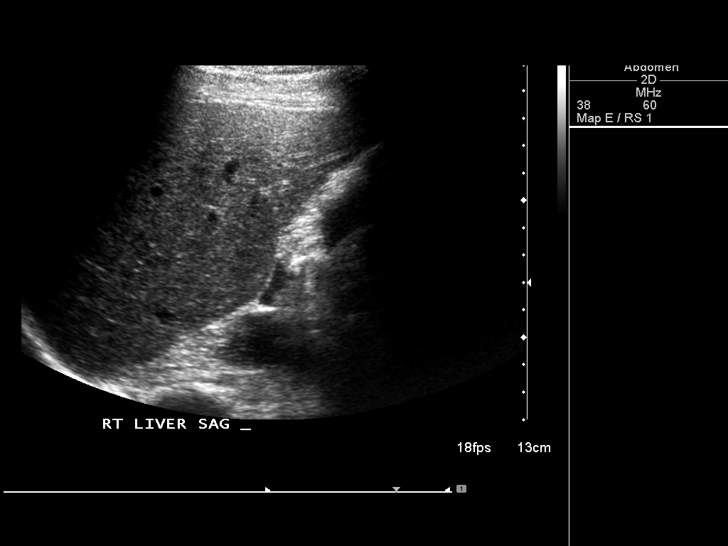
[im 17/50]
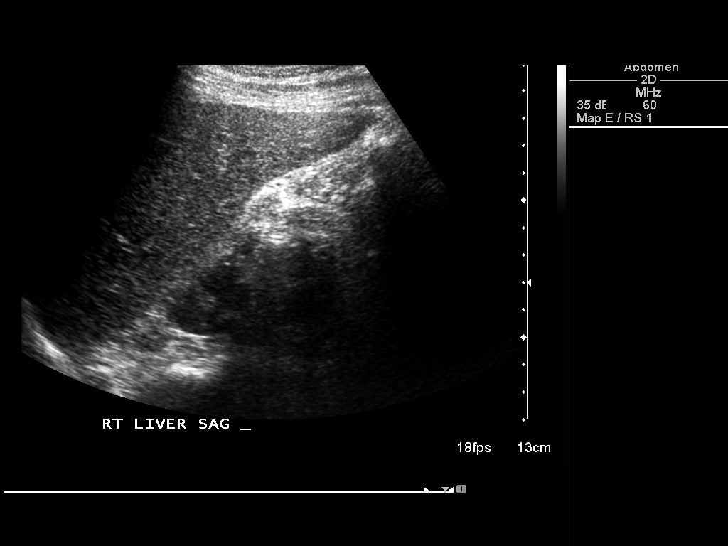
[im 19/50]
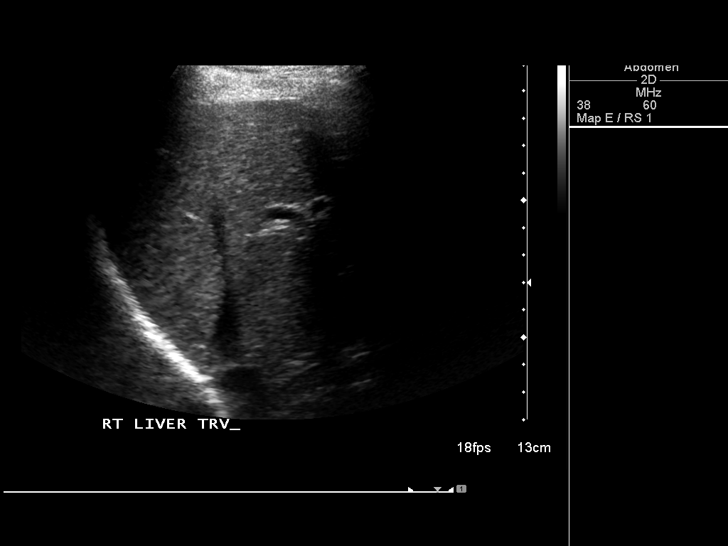
[im 23/50]
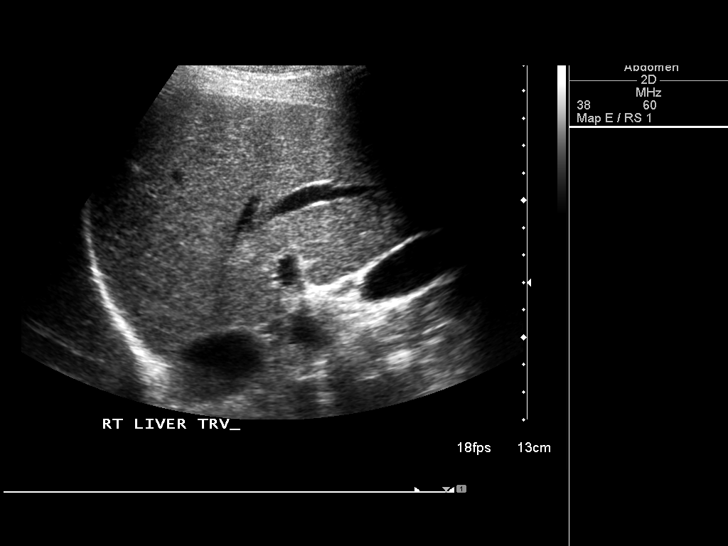
[im 27/50]
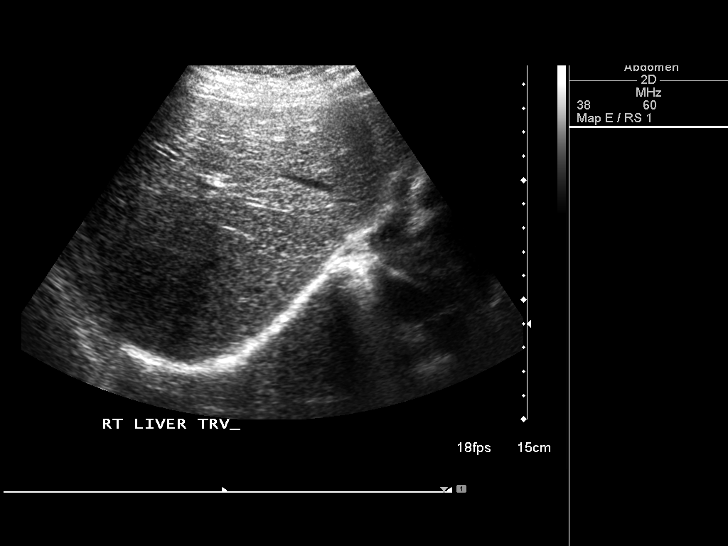
[im 31/50]
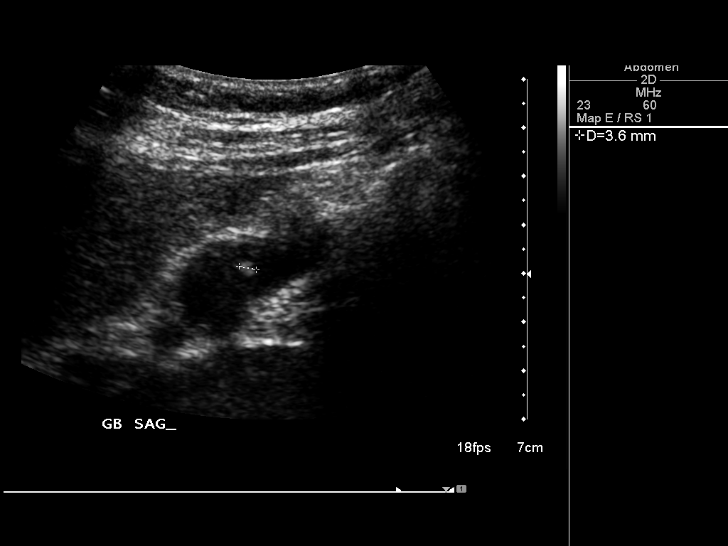
[im 33/50]
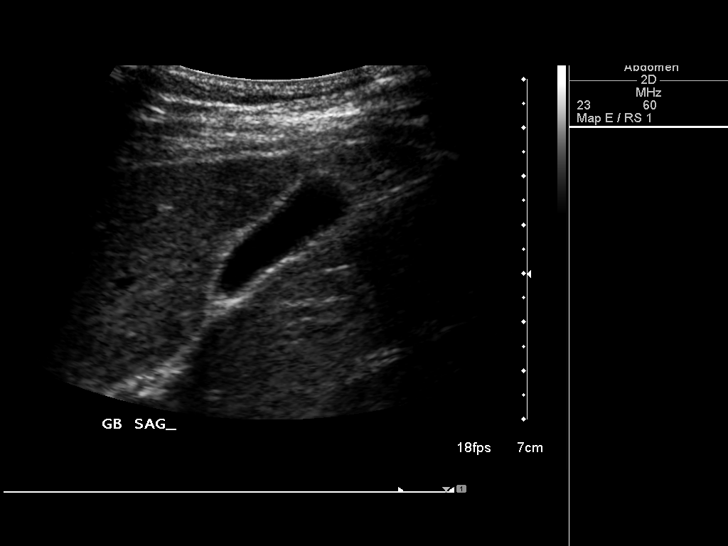
[im 37/50]
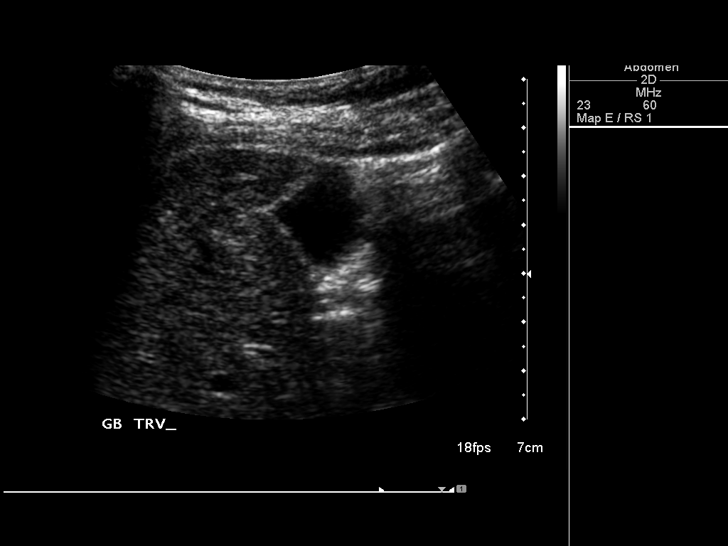
[im 41/50]
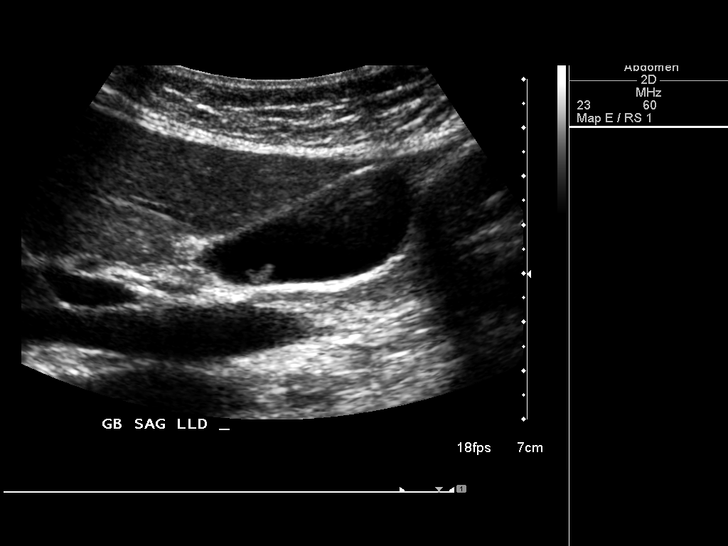
[im 45/50]
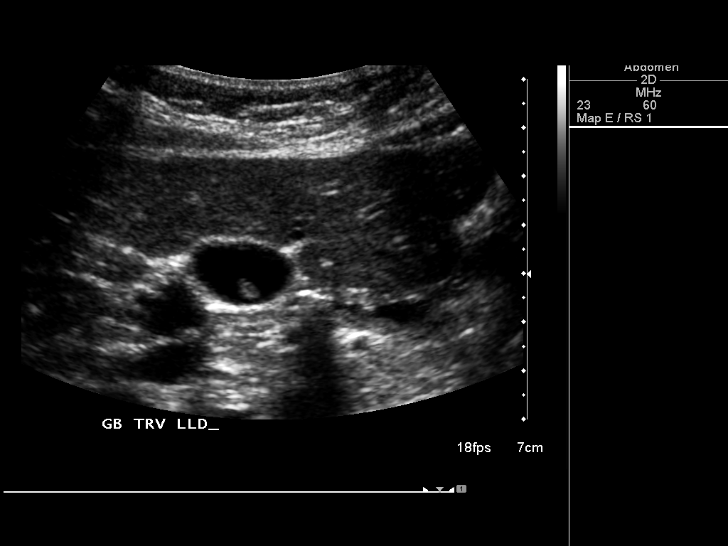
[im 50/50]
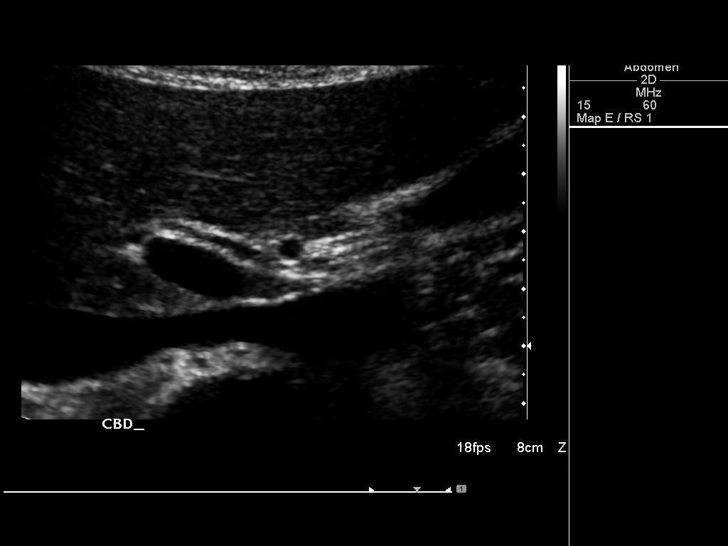

[14 of 25 positions shown; findings below may reference images not displayed]

FINDINGS: Gallbladder:

The gallbladder is adequately distended. Two non mobile echogenic
nonshadowing foci are observed consistent with polyps measuring 5
and 6 mm in diameter respectively. There is no gallbladder wall
thickening, pericholecystic fluid, or positive sonographic Murphy's
sign.

Common bile duct:

Diameter: 3 mm

Liver:

The hepatic echotexture is normal. There is no focal mass or ductal
dilation.
IMPRESSION: Stable appearance of the 2 gallbladder polyps. No evidence of
gallstones or acute cholecystitis.

Normal appearance of the liver and common bile duct.

## 2018-03-21 DIAGNOSIS — K5732 Diverticulitis of large intestine without perforation or abscess without bleeding: Secondary | ICD-10-CM | POA: Diagnosis not present

## 2018-03-22 DIAGNOSIS — K5732 Diverticulitis of large intestine without perforation or abscess without bleeding: Secondary | ICD-10-CM | POA: Diagnosis not present

## 2018-03-23 DIAGNOSIS — K5732 Diverticulitis of large intestine without perforation or abscess without bleeding: Secondary | ICD-10-CM | POA: Diagnosis not present

## 2018-03-24 DIAGNOSIS — K5732 Diverticulitis of large intestine without perforation or abscess without bleeding: Secondary | ICD-10-CM | POA: Diagnosis not present

## 2018-06-11 DIAGNOSIS — R109 Unspecified abdominal pain: Secondary | ICD-10-CM

## 2018-06-11 DIAGNOSIS — E871 Hypo-osmolality and hyponatremia: Secondary | ICD-10-CM

## 2018-06-11 DIAGNOSIS — K529 Noninfective gastroenteritis and colitis, unspecified: Secondary | ICD-10-CM

## 2018-06-11 DIAGNOSIS — K921 Melena: Secondary | ICD-10-CM | POA: Diagnosis not present

## 2018-06-12 DIAGNOSIS — E871 Hypo-osmolality and hyponatremia: Secondary | ICD-10-CM | POA: Diagnosis not present

## 2018-06-12 DIAGNOSIS — R109 Unspecified abdominal pain: Secondary | ICD-10-CM | POA: Diagnosis not present

## 2018-06-12 DIAGNOSIS — K921 Melena: Secondary | ICD-10-CM | POA: Diagnosis not present

## 2018-06-12 DIAGNOSIS — K529 Noninfective gastroenteritis and colitis, unspecified: Secondary | ICD-10-CM | POA: Diagnosis not present

## 2018-06-13 DIAGNOSIS — K529 Noninfective gastroenteritis and colitis, unspecified: Secondary | ICD-10-CM | POA: Diagnosis not present

## 2018-06-13 DIAGNOSIS — K921 Melena: Secondary | ICD-10-CM | POA: Diagnosis not present

## 2018-06-13 DIAGNOSIS — E871 Hypo-osmolality and hyponatremia: Secondary | ICD-10-CM | POA: Diagnosis not present

## 2018-06-13 DIAGNOSIS — R109 Unspecified abdominal pain: Secondary | ICD-10-CM | POA: Diagnosis not present

## 2018-06-14 DIAGNOSIS — E871 Hypo-osmolality and hyponatremia: Secondary | ICD-10-CM | POA: Diagnosis not present

## 2018-06-14 DIAGNOSIS — K529 Noninfective gastroenteritis and colitis, unspecified: Secondary | ICD-10-CM | POA: Diagnosis not present

## 2018-06-14 DIAGNOSIS — R109 Unspecified abdominal pain: Secondary | ICD-10-CM | POA: Diagnosis not present

## 2018-06-14 DIAGNOSIS — K921 Melena: Secondary | ICD-10-CM | POA: Diagnosis not present

## 2019-11-09 IMAGING — US US ABDOMEN COMPLETE
1 series · 14 of 25 positions shown · non-contrast
Comparison: 04/06/2016

CLINICAL DATA: Abdominal pain

EXAM:
ABDOMEN ULTRASOUND COMPLETE

[Series 1: us abdomen complete · 0.19mm/px · 14 of 82 slices shown]
[im 1/82]
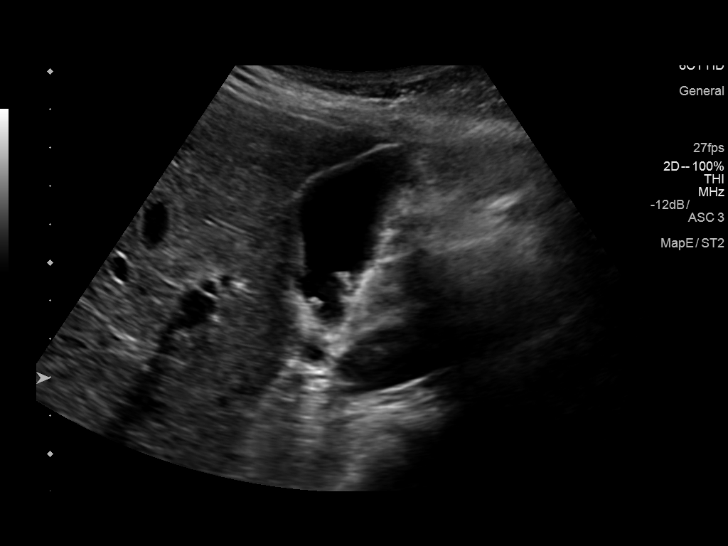
[im 7/82]
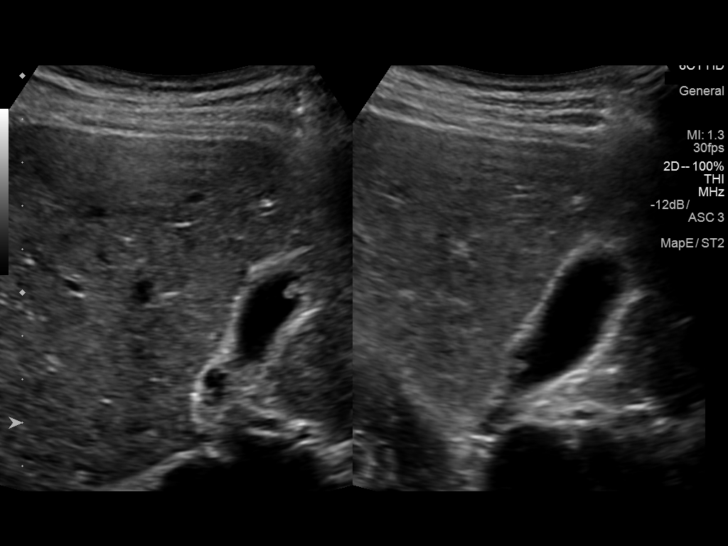
[im 14/82]
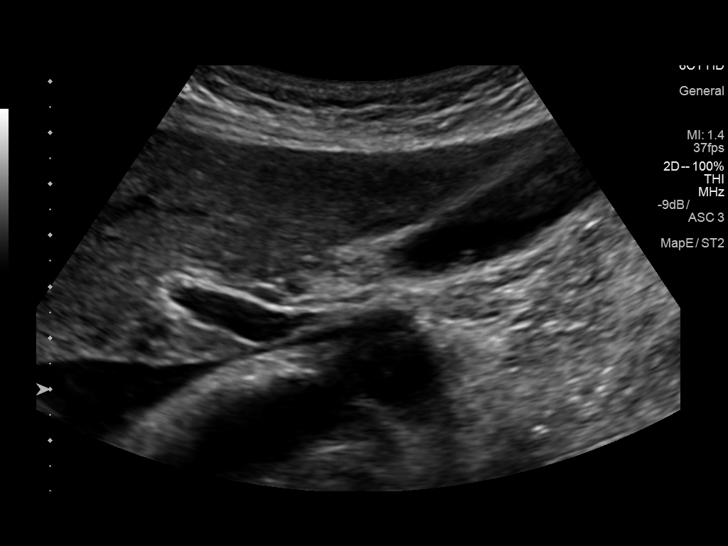
[im 21/82]
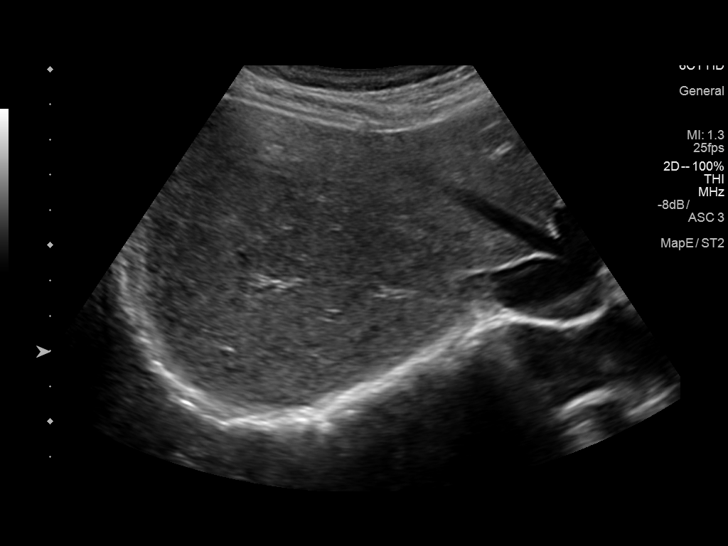
[im 28/82]
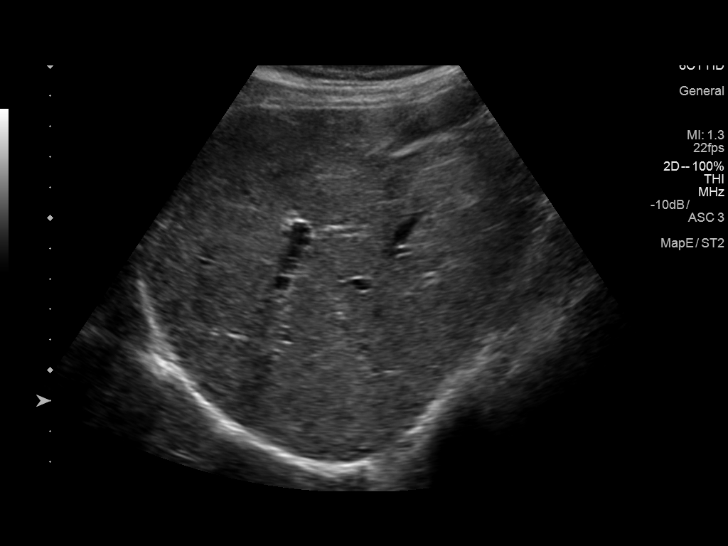
[im 31/82]
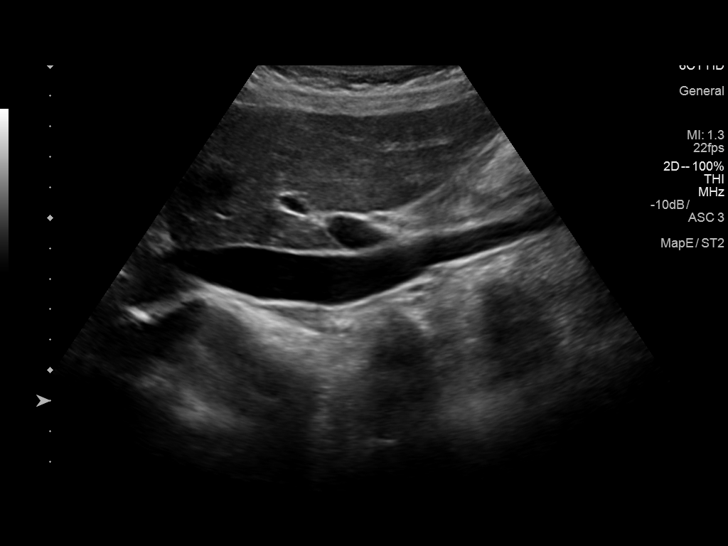
[im 38/82]
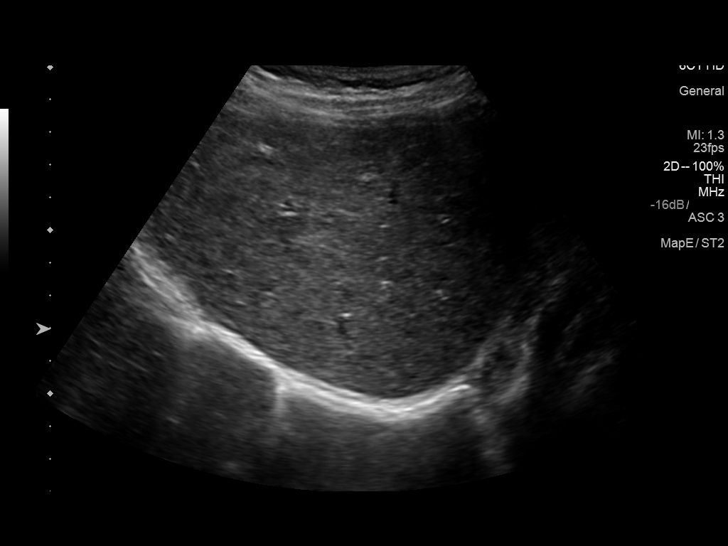
[im 44/82]
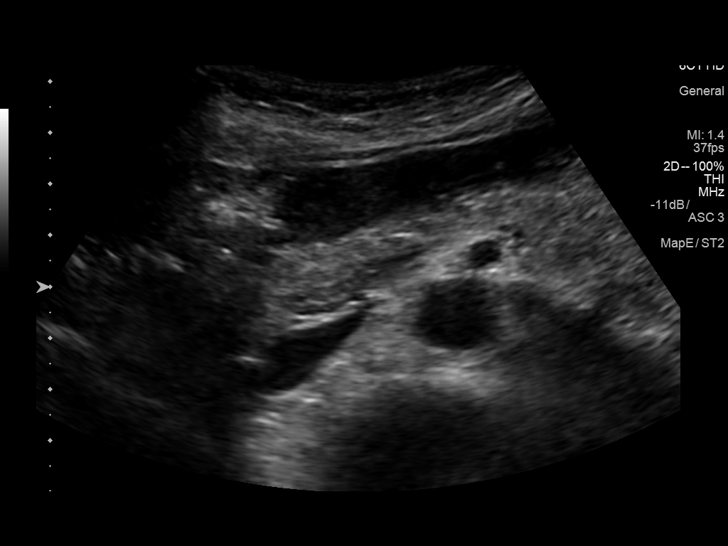
[im 51/82]
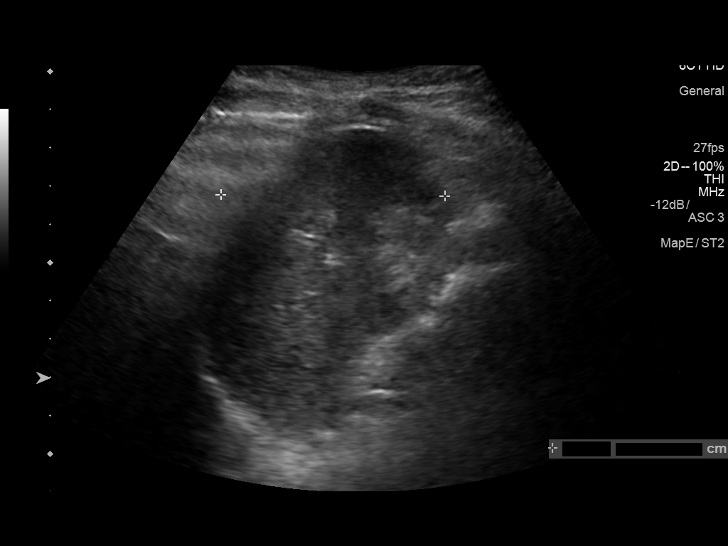
[im 55/82]
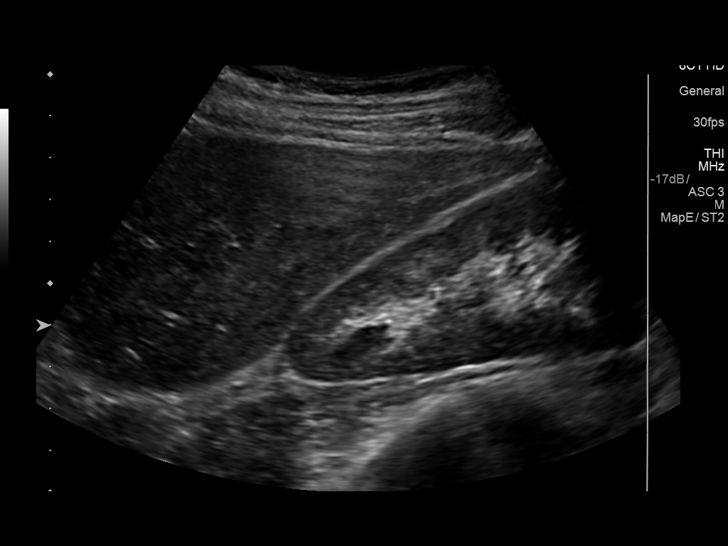
[im 61/82]
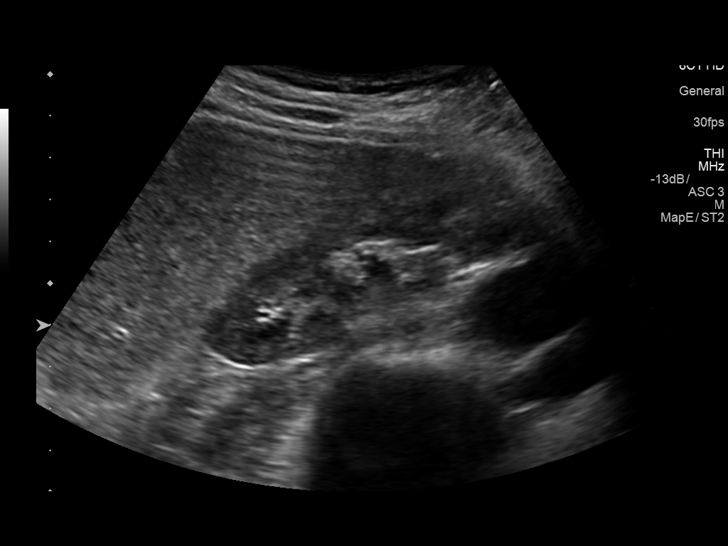
[im 68/82]
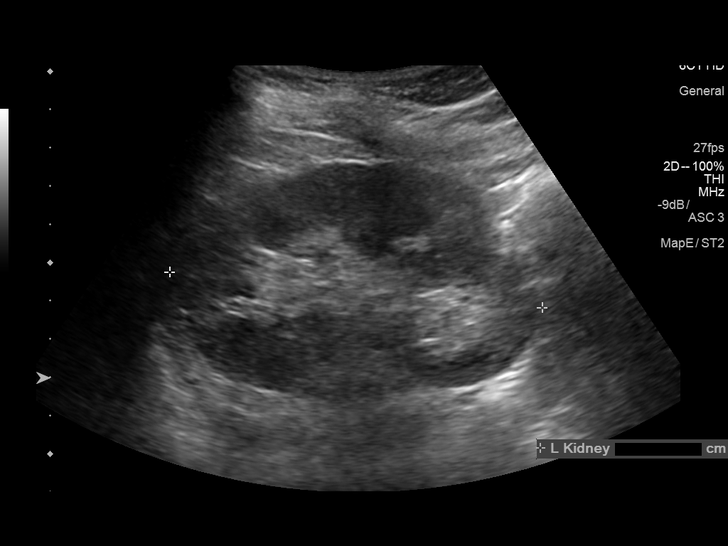
[im 75/82]
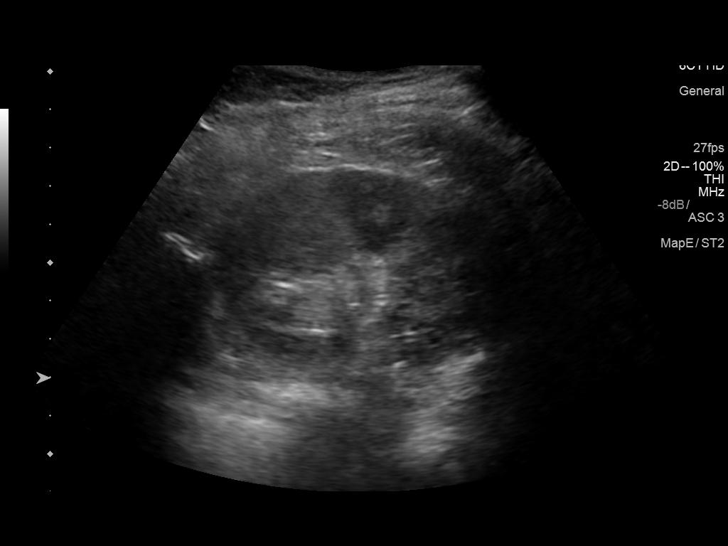
[im 82/82]
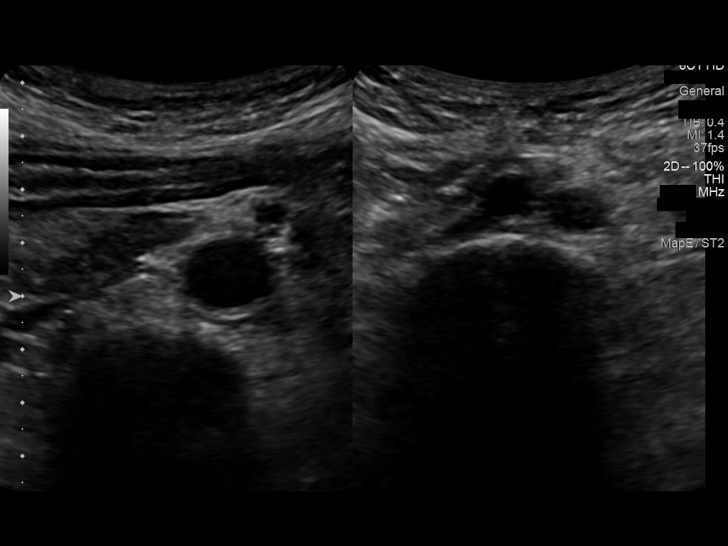

[14 of 25 positions shown; findings below may reference images not displayed]

FINDINGS: Gallbladder: Gallbladder is well distended. Non mobile echogenic
foci are noted consistent with gallbladder polyps. The largest of
these measures 6 mm. No pericholecystic fluid is noted.

Common bile duct: Diameter: 2.6 mm.

Liver: No focal lesion identified. Within normal limits in
parenchymal echogenicity. Portal vein is patent on color Doppler
imaging with normal direction of blood flow towards the liver.

IVC: No abnormality visualized.

Pancreas: Visualized portion unremarkable.

Spleen: Size and appearance within normal limits.

Right Kidney: Length: 9.5 cm.. Echogenicity within normal limits. No
mass or hydronephrosis visualized.

Left Kidney: Length: 9.8 cm.. Echogenicity within normal limits. No
mass or hydronephrosis visualized.

Abdominal aorta: No aneurysm visualized.

Other findings: None.
IMPRESSION: Gallbladder polyps.  No other focal abnormality is seen.

## 2021-02-10 ENCOUNTER — Other Ambulatory Visit: Payer: Self-pay | Admitting: Gastroenterology

## 2021-02-10 DIAGNOSIS — K824 Cholesterolosis of gallbladder: Secondary | ICD-10-CM

## 2021-03-05 ENCOUNTER — Ambulatory Visit
Admission: RE | Admit: 2021-03-05 | Discharge: 2021-03-05 | Disposition: A | Discharge status: Home / Self Care | Payer: BC Managed Care – PPO | Source: Ambulatory Visit | Attending: Gastroenterology | Admitting: Gastroenterology

## 2021-03-05 DIAGNOSIS — K824 Cholesterolosis of gallbladder: Secondary | ICD-10-CM
